# Patient Record
Sex: Female | Born: 1970 | Race: White | Hispanic: No | Marital: Single | Smoking: Current every day smoker
Health system: Southern US, Community
[De-identification: ages and names within clinical notes are randomized; demographics above are authoritative.]

---

## 2014-10-22 ENCOUNTER — Ambulatory Visit: Payer: Self-pay | Attending: Oncology

## 2014-10-22 ENCOUNTER — Ambulatory Visit
Admission: RE | Admit: 2014-10-22 | Discharge: 2014-10-22 | Disposition: A | Discharge status: Home / Self Care | Payer: Self-pay | Source: Ambulatory Visit | Attending: Oncology | Admitting: Oncology

## 2014-10-22 ENCOUNTER — Encounter (INDEPENDENT_AMBULATORY_CARE_PROVIDER_SITE_OTHER): Payer: Self-pay

## 2014-10-22 VITALS — BP 137/90 | HR 102 | Temp 98.2°F | Ht 62.0 in | Wt 134.5 lb

## 2014-10-22 DIAGNOSIS — Z0000 Encounter for general adult medical examination without abnormal findings: Secondary | ICD-10-CM

## 2014-10-22 NOTE — Progress Notes (Signed)
Subjective:     Patient ID: Erica Gonzales, female   DOB: 1970/09/06, 44 y.o.   MRN: [id number]  HPI   Review of Systems     Objective:   Physical Exam     A Physical Exam  Pulmonary/Chest: Right breast exhibits no inverted nipple, no mass, no nipple discharge, no skin change and no tenderness. Left breast exhibits no inverted nipple, no mass, no nipple discharge, no skin change and no tenderness. Breasts are asymmetrical.  Right breast 1 cup size larger than left.  Genitourinary: No labial fusion. There is no rash, tenderness, lesion or injury on the right labia. There is no rash, tenderness, lesion or injury on the left labia. Uterus is not deviated, not enlarged, not fixed and not tender. Cervix exhibits no motion tenderness, no discharge and no friability. Right adnexum displays no mass, no tenderness and no fullness. Left adnexum displays no mass, no tenderness and no fullness. No erythema, tenderness or bleeding in the vagina. No foreign body around the vagina. No signs of injury around the vagina. No vaginal discharge found.       Assessment:     44 year old patient presents for [facility] clinic visit. Patient screened, and meets BCCCP eligibility. Patient does not have insurance, Medicare or Medicaid. Handout given on Affordable Care Act. Instructed patient on breast self-exam using teach back method. CBE unremarkable. Pelvic exam normal.    Plan:     Sent for bilateral screening mammogram. Specimen collected for pap.          ssessment:         Plan:     Will follow per protocol

## 2014-10-22 NOTE — Progress Notes (Signed)
Subjective:     Patient ID: Erica Gonzales, female   DOB: 09/12/1970, 44 y.o.   MRN: [id number]  HPI   Review of Systems     Objective:   Physical Exam  Pulmonary/Chest: Right breast exhibits no inverted nipple, no mass, no nipple discharge, no skin change and no tenderness. Left breast exhibits no inverted nipple, no mass, no nipple discharge, no skin change and no tenderness. Breasts are asymmetrical.  Right breast 1 cup size larger than left.  Genitourinary: No labial fusion. There is no rash, tenderness, lesion or injury on the right labia. There is no rash, tenderness, lesion or injury on the left labia. Uterus is not deviated, not enlarged, not fixed and not tender. Cervix exhibits no motion tenderness, no discharge and no friability. Right adnexum displays no mass, no tenderness and no fullness. Left adnexum displays no mass, no tenderness and no fullness. No erythema, tenderness or bleeding in the vagina. No foreign body around the vagina. No signs of injury around the vagina. No vaginal discharge found.       Assessment:     44 year old patient presents for [facility] clinic visit.  Patient screened, and meets BCCCP eligibility.  Patient does not have insurance, Medicare or Medicaid.  Handout given on Affordable Care Act. Instructed patient on breast self-exam using teach back method. CBE unremarkable.  Pelvic exam normal.    Plan:     Sent for bilateral screening mammogram.  Specimen collected for pap.

## 2014-10-24 LAB — PAP LB AND HPV HIGH-RISK
HPV, high-risk: NEGATIVE
PAP Smear Comment: 0

## 2014-11-12 NOTE — Progress Notes (Signed)
Phoned patient with normal mammogram, and pap results.  Nest pap due in 5 years per guidelines.  Reminded to return in one year for annual screening, and mammogram.  Patient reports she received bill for pap from [facility] for pap.  She is to mail copy to [employer].  Copy to [facility].

## 2017-01-13 ENCOUNTER — Encounter: Payer: Self-pay | Admitting: Emergency Medicine

## 2017-01-13 ENCOUNTER — Emergency Department
Admission: EM | Admit: 2017-01-13 | Discharge: 2017-01-13 | Disposition: A | Discharge status: Home / Self Care | Payer: No Typology Code available for payment source | Attending: Emergency Medicine | Admitting: Emergency Medicine

## 2017-01-13 ENCOUNTER — Emergency Department: Payer: No Typology Code available for payment source

## 2017-01-13 DIAGNOSIS — S0083XA Contusion of other part of head, initial encounter: Secondary | ICD-10-CM | POA: Diagnosis not present

## 2017-01-13 DIAGNOSIS — Y92410 Unspecified street and highway as the place of occurrence of the external cause: Secondary | ICD-10-CM | POA: Diagnosis not present

## 2017-01-13 DIAGNOSIS — Y9389 Activity, other specified: Secondary | ICD-10-CM | POA: Insufficient documentation

## 2017-01-13 DIAGNOSIS — S0012XA Contusion of left eyelid and periocular area, initial encounter: Secondary | ICD-10-CM | POA: Insufficient documentation

## 2017-01-13 DIAGNOSIS — Y999 Unspecified external cause status: Secondary | ICD-10-CM | POA: Diagnosis not present

## 2017-01-13 DIAGNOSIS — S161XXA Strain of muscle, fascia and tendon at neck level, initial encounter: Secondary | ICD-10-CM | POA: Diagnosis not present

## 2017-01-13 DIAGNOSIS — F17210 Nicotine dependence, cigarettes, uncomplicated: Secondary | ICD-10-CM | POA: Insufficient documentation

## 2017-01-13 DIAGNOSIS — R51 Headache: Secondary | ICD-10-CM | POA: Insufficient documentation

## 2017-01-13 DIAGNOSIS — S199XXA Unspecified injury of neck, initial encounter: Secondary | ICD-10-CM | POA: Diagnosis present

## 2017-01-13 MED ORDER — MORPHINE SULFATE (PF) 2 MG/ML IV SOLN
2.0000 mg | Freq: Once | INTRAVENOUS | Status: AC
  Administered 2017-01-13: 2 mg via INTRAVENOUS
  Filled 2017-01-13: qty 1

## 2017-01-13 MED ORDER — MORPHINE SULFATE (PF) 2 MG/ML IV SOLN
INTRAVENOUS | Status: AC
  Filled 2017-01-13: qty 1

## 2017-01-13 MED ORDER — MELOXICAM 15 MG PO TABS: 15.0000 mg | ORAL_TABLET | Freq: Every day | ORAL | 2 refills | Status: AC

## 2017-01-13 MED ORDER — MORPHINE SULFATE (PF) 2 MG/ML IV SOLN
2.0000 mg | Freq: Once | INTRAVENOUS | Status: AC
Start: 2017-01-13 — End: 2017-01-13
  Administered 2017-01-13: 2 mg via INTRAVENOUS
  Filled 2017-01-13: qty 1

## 2017-01-13 MED ORDER — MORPHINE SULFATE (PF) 2 MG/ML IV SOLN
2.0000 mg | Freq: Once | INTRAVENOUS | Status: AC
  Administered 2017-01-13: 2 mg via INTRAVENOUS

## 2017-01-13 MED ORDER — BACLOFEN 10 MG PO TABS: 10.0000 mg | ORAL_TABLET | Freq: Every day | ORAL | 1 refills | Status: AC

## 2017-01-13 MED ORDER — OXYCODONE-ACETAMINOPHEN 5-325 MG PO TABS: 1.0000 | ORAL_TABLET | Freq: Four times a day (QID) | ORAL | 0 refills | Status: AC | PRN
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
## 2017-01-13 NOTE — ED Notes (Signed)
AAOx3.  Skin warm and dry. MAE equally and strong.  Continue to monitor.
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
## 2017-01-13 NOTE — ED Notes (Signed)
Patient's family member upset, expressing concern that patient is being seen by a PA and that the patient has been waiting 30 minutes to see a provider for this emergency.  Reassurance given.  Discussed that patient has been evaluated by an experienced triage nurse and that patient's condition is stable and that she was determined, by her triage assessment, to meet criteria to be evaluated in [facility].  Reassurance given that all appropriate testing and imaging would be ordered as part of the patient's medical screening exam and evaluation.  Reassurance only moderately effective.  Continue to monitor.

## 2017-01-13 NOTE — Discharge Instructions (Signed)
Follow-up with your regular doctor if you have any problems, if your neck and back are not better in 5-7 days please call the orthopedic doctor for follow-up, if you have any problems with your eye least call the eye doctor, take medication as prescribed, he has been given meloxicam 15 mg per day for pain and inflammation, baclofen 10 mg 3 times a day for muscle pain and strain, Percocet 5/325 for pain not controlled by the meloxicam, and beware the addictive quality of the Percocet
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
## 2019-03-19 ENCOUNTER — Ambulatory Visit
Admission: EM | Admit: 2019-03-19 | Discharge: 2019-03-19 | Disposition: A | Discharge status: Home / Self Care | Payer: Self-pay | Attending: Emergency Medicine | Admitting: Emergency Medicine

## 2019-03-19 ENCOUNTER — Other Ambulatory Visit: Payer: Self-pay

## 2019-03-19 DIAGNOSIS — N390 Urinary tract infection, site not specified: Secondary | ICD-10-CM | POA: Insufficient documentation

## 2019-03-19 DIAGNOSIS — R3915 Urgency of urination: Secondary | ICD-10-CM

## 2019-03-19 LAB — POCT URINALYSIS DIP (MANUAL ENTRY)
Glucose, UA: NEGATIVE mg/dL
Nitrite, UA: POSITIVE — AB
Protein Ur, POC: 100 mg/dL — AB
Spec Grav, UA: >=1.03 — AB (ref 1.010–1.025)
Urobilinogen, UA: 1 E.U./dL
pH, UA: 5.5 (ref 5.0–8.0)

## 2019-03-19 MED ORDER — CEPHALEXIN 500 MG PO CAPS
500.0000 mg | ORAL_CAPSULE | Freq: Three times a day (TID) | ORAL | 0 refills | Status: AC
Start: 2019-03-19 — End: 2019-03-26

## 2019-03-19 NOTE — ED Provider Notes (Signed)
Erica Gonzales    CSN: [id number] Arrival date & time: 03/19/19  1241      History   Chief Complaint Chief Complaint  Patient presents with  . Urinary Tract Infection    HPI Erica Gonzales is a 49 y.o. female.   Patient presents with 2 to 3-week history of dysuria and frequency.  She denies fever, chills, abdominal pain, back pain, pelvic pain, vaginal discharge, or other symptoms.  No treatments attempted at home.    The history is provided by the patient.    History reviewed. No pertinent past medical history.  There are no problems to display for this patient.   History reviewed. No pertinent surgical history.  OB History   No obstetric history on file.      Home Medications    Prior to Admission medications   Medication Sig Start Date End Date Taking? Authorizing Provider  Docusate Sodium (DSS) 100 MG CAPS Take by mouth. 12/31/14  Yes [provider]  baclofen (LIORESAL) 10 MG tablet Take by mouth.    [provider]  cephALEXin (KEFLEX) 500 MG capsule Take 1 capsule (500 mg total) by mouth 3 (three) times daily for 7 days. 03/19/19 03/26/19  Sharion Balloon, NP  escitalopram (LEXAPRO) 20 MG tablet Take by mouth.    [provider]  hydrochlorothiazide (HYDRODIURIL) 25 MG tablet Take by mouth.    [provider]  lisinopril (ZESTRIL) 10 MG tablet Take by mouth.    [provider]  meloxicam (MOBIC) 7.5 MG tablet Take by mouth.    [provider]  oxyCODONE-acetaminophen (PERCOCET/ROXICET) 5-325 MG tablet Take 1 tablet by mouth every 6 (six) hours as needed for severe pain. 01/13/17   Versie Starks, PA-C  oxyCODONE-acetaminophen (PERCOCET/ROXICET) 5-325 MG tablet Take by mouth.    [provider]    Family History Family History  Problem Relation Age of Onset  . Breast cancer Other   . Breast cancer Mother 55  . Hypertension Mother   . Diabetes Father   . Hyperlipidemia Father   . Hypertension  Father   . Migraines Father   . Heart failure Father     Social History Social History   Tobacco Use  . Smoking status: Current Every Day Smoker    Types: Cigarettes  . Smokeless tobacco: Never Used  Substance Use Topics  . Alcohol use: Yes  . Drug use: Never     Allergies   Patient has no known allergies.   Review of Systems Review of Systems  Constitutional: Negative for chills and fever.  HENT: Negative for ear pain and sore throat.   Eyes: Negative for pain and visual disturbance.  Respiratory: Negative for cough and shortness of breath.   Cardiovascular: Negative for chest pain and palpitations.  Gastrointestinal: Negative for abdominal pain, diarrhea, nausea and vomiting.  Genitourinary: Positive for dysuria and frequency. Negative for flank pain, hematuria, pelvic pain and vaginal discharge.  Musculoskeletal: Negative for arthralgias and back pain.  Skin: Negative for color change and rash.  Neurological: Negative for seizures and syncope.  All other systems reviewed and are negative.    Physical Exam Triage Vital Signs ED Triage Vitals  Enc Vitals Group     BP      Pulse      Resp      Temp      Temp src      SpO2      Weight  Height      Head Circumference      Peak Flow      Pain Score      Pain Loc      Pain Edu?      Excl. in GC?    No data found.  Updated Vital Signs BP 131/88 (BP Location: Left Arm)   Pulse (!) 120   Temp 98.6 F (37 C) (Oral)   Resp 16   Wt 145 lb (65.8 kg)   LMP 04/15/2014 (Approximate)   SpO2 95%   BMI 25.69 kg/m   Visual Acuity Right Eye Distance:   Left Eye Distance:   Bilateral Distance:    Right Eye Near:   Left Eye Near:    Bilateral Near:     Physical Exam Vitals and nursing note reviewed.  Constitutional:      General: She is not in acute distress.    Appearance: She is well-developed.  HENT:     Head: Normocephalic and atraumatic.     Mouth/Throat:     Mouth: Mucous membranes are  moist.     Pharynx: Oropharynx is clear.  Eyes:     Conjunctiva/sclera: Conjunctivae normal.  Cardiovascular:     Rate and Rhythm: Normal rate and regular rhythm.     Heart sounds: No murmur.  Pulmonary:     Effort: Pulmonary effort is normal. No respiratory distress.     Breath sounds: Normal breath sounds.  Abdominal:     General: Bowel sounds are normal.     Palpations: Abdomen is soft.     Tenderness: There is no abdominal tenderness. There is no right CVA tenderness, left CVA tenderness, guarding or rebound.  Musculoskeletal:     Cervical back: Neck supple.  Skin:    General: Skin is warm and dry.     Findings: No rash.  Neurological:     General: No focal deficit present.     Mental Status: She is alert and oriented to person, place, and time.  Psychiatric:        Mood and Affect: Mood normal.        Behavior: Behavior normal.      UC Treatments / Results  Labs (all labs ordered are listed, but only abnormal results are displayed) Labs Reviewed  POCT URINALYSIS DIP (MANUAL ENTRY) - Abnormal; Notable for the following components:      Result Value   Color, UA orange (*)    Clarity, UA cloudy (*)    Bilirubin, UA small (*)    Ketones, POC UA small (15) (*)    Spec Grav, UA >=1.030 (*)    Blood, UA large (*)    Protein Ur, POC =100 (*)    Nitrite, UA Positive (*)    Leukocytes, UA Trace (*)    All other components within normal limits  URINE CULTURE    EKG   Radiology No results found.  Procedures Procedures (including critical care time)  Medications Ordered in UC Medications - No data to display  Initial Impression / Assessment and Plan / UC Course  I have reviewed the triage vital signs and the nursing notes.  Pertinent labs & imaging results that were available during my care of the patient were reviewed by me and considered in my medical decision making (see chart for details).   UTI.  Treating with Keflex.  Instructed patient to keep yourself  hydrated with at least 8 to 10 glasses of water each day.  Instructed her  to follow-up with her PCP if her symptoms are not improving.  Patient agrees to plan of care.     Final Clinical Impressions(s) / UC Diagnoses   Final diagnoses:  Urinary tract infection without hematuria, site unspecified     Discharge Instructions     Take the antibiotic as directed.  Make sure you are staying hydrated with at least 8 to 10 glasses of water each day.    Follow-up with your primary care provider if your symptoms are not improving.        ED Prescriptions    Medication Sig Dispense Auth. Provider   cephALEXin (KEFLEX) 500 MG capsule Take 1 capsule (500 mg total) by mouth 3 (three) times daily for 7 days. 20 capsule Mickie Bail, NP     PDMP not reviewed this encounter.   Mickie Bail, NP 03/19/19 1321

## 2019-03-19 NOTE — Discharge Instructions (Addendum)
Take the antibiotic as directed.  Make sure you are staying hydrated with at least 8 to 10 glasses of water each day.    Follow-up with your primary care provider if your symptoms are not improving.

## 2019-03-19 NOTE — ED Triage Notes (Signed)
Pt is here with frequent/ burning when urination this developed a week ago. States she has NOT taken any meds to relieve discomfort.

## 2019-03-20 LAB — URINE CULTURE: Culture: NO GROWTH
